# Patient Record
Sex: Male | Born: 1985 | Race: White | Hispanic: No | Marital: Single | Smoking: Smoker, current status unknown
Health system: Southern US, Community
[De-identification: ages and names within clinical notes are randomized; demographics above are authoritative.]

## PROBLEM LIST (undated history)

## (undated) HISTORY — PX: FOOT SURGERY: SHX648

---

## 2013-06-05 ENCOUNTER — Emergency Department (HOSPITAL_COMMUNITY): Payer: No Typology Code available for payment source | Admitting: Anesthesiology

## 2013-06-05 ENCOUNTER — Encounter (HOSPITAL_COMMUNITY): Payer: No Typology Code available for payment source | Admitting: Anesthesiology

## 2013-06-05 ENCOUNTER — Observation Stay (HOSPITAL_COMMUNITY)
Admission: EM | Admit: 2013-06-05 | Discharge: 2013-06-06 | Disposition: A | Discharge status: Home / Self Care | Payer: No Typology Code available for payment source | Attending: Urology | Admitting: Urology

## 2013-06-05 ENCOUNTER — Other Ambulatory Visit: Payer: Self-pay | Admitting: Urology

## 2013-06-05 ENCOUNTER — Encounter (HOSPITAL_COMMUNITY): Admission: EM | Disposition: A | Discharge status: Home / Self Care | Payer: Self-pay | Source: Home / Self Care

## 2013-06-05 ENCOUNTER — Emergency Department (HOSPITAL_COMMUNITY): Payer: No Typology Code available for payment source

## 2013-06-05 ENCOUNTER — Encounter (HOSPITAL_COMMUNITY): Payer: Self-pay | Admitting: Emergency Medicine

## 2013-06-05 DIAGNOSIS — N4889 Other specified disorders of penis: Secondary | ICD-10-CM | POA: Insufficient documentation

## 2013-06-05 DIAGNOSIS — X500XXA Overexertion from strenuous movement or load, initial encounter: Secondary | ICD-10-CM | POA: Insufficient documentation

## 2013-06-05 DIAGNOSIS — S39840A Fracture of corpus cavernosum penis, initial encounter: Principal | ICD-10-CM | POA: Insufficient documentation

## 2013-06-05 HISTORY — PX: CYSTOSCOPY: SHX5120

## 2013-06-05 HISTORY — PX: REPAIR OF FRACTURED PENIS: SHX6063

## 2013-06-05 LAB — URINE MICROSCOPIC-ADD ON

## 2013-06-05 LAB — URINALYSIS, ROUTINE W REFLEX MICROSCOPIC
BILIRUBIN URINE: NEGATIVE
Glucose, UA: NEGATIVE mg/dL
Hgb urine dipstick: NEGATIVE
Ketones, ur: NEGATIVE mg/dL
Nitrite: NEGATIVE
Protein, ur: NEGATIVE mg/dL
Specific Gravity, Urine: 1.02 (ref 1.005–1.030)
UROBILINOGEN UA: 1 mg/dL (ref 0.0–1.0)
pH: 7.5 (ref 5.0–8.0)

## 2013-06-05 SURGERY — REPAIR, FRACTURE, PENIS
Anesthesia: General | Site: Penis

## 2013-06-05 MED ORDER — FENTANYL CITRATE 0.05 MG/ML IJ SOLN
INTRAMUSCULAR | Status: AC
  Filled 2013-06-05: qty 5

## 2013-06-05 MED ORDER — FENTANYL CITRATE 0.05 MG/ML IJ SOLN
25.0000 ug | INTRAMUSCULAR | Status: DC | PRN
  Administered 2013-06-05 (×2): 50 ug via INTRAVENOUS

## 2013-06-05 MED ORDER — MIDAZOLAM HCL 5 MG/5ML IJ SOLN
INTRAMUSCULAR | Status: DC | PRN
  Administered 2013-06-05: 2 mg via INTRAVENOUS

## 2013-06-05 MED ORDER — 0.9 % SODIUM CHLORIDE (POUR BTL) OPTIME
TOPICAL | Status: DC | PRN
  Administered 2013-06-05: 1000 mL

## 2013-06-05 MED ORDER — HYDROCODONE-ACETAMINOPHEN 5-325 MG PO TABS: 1.0000 | ORAL_TABLET | ORAL | Status: DC | PRN

## 2013-06-05 MED ORDER — MEPERIDINE HCL 50 MG/ML IJ SOLN: 6.2500 mg | INTRAMUSCULAR | Status: DC | PRN

## 2013-06-05 MED ORDER — LACTATED RINGERS IV SOLN: INTRAVENOUS | Status: DC

## 2013-06-05 MED ORDER — PROMETHAZINE HCL 25 MG/ML IJ SOLN: 6.2500 mg | INTRAMUSCULAR | Status: DC | PRN

## 2013-06-05 MED ORDER — SODIUM CHLORIDE 0.9 % IR SOLN
Status: DC | PRN
  Administered 2013-06-05: 1000 mL

## 2013-06-05 MED ORDER — CEFAZOLIN SODIUM-DEXTROSE 2-3 GM-% IV SOLR
INTRAVENOUS | Status: DC | PRN
  Administered 2013-06-05: 2 g via INTRAVENOUS

## 2013-06-05 MED ORDER — ZOLPIDEM TARTRATE 5 MG PO TABS: 5.0000 mg | ORAL_TABLET | Freq: Every evening | ORAL | Status: DC | PRN

## 2013-06-05 MED ORDER — ONDANSETRON HCL 4 MG/2ML IJ SOLN: 4.0000 mg | INTRAMUSCULAR | Status: DC | PRN

## 2013-06-05 MED ORDER — FENTANYL CITRATE 0.05 MG/ML IJ SOLN
INTRAMUSCULAR | Status: AC
  Filled 2013-06-05: qty 2

## 2013-06-05 MED ORDER — SODIUM CHLORIDE 0.9 % IV BOLUS (SEPSIS)
1000.0000 mL | Freq: Once | INTRAVENOUS | Status: AC
  Administered 2013-06-05: 1000 mL via INTRAVENOUS

## 2013-06-05 MED ORDER — NICOTINE 21 MG/24HR TD PT24
21.0000 mg | MEDICATED_PATCH | Freq: Once | TRANSDERMAL | Status: DC
  Administered 2013-06-05: 21 mg via TRANSDERMAL
  Filled 2013-06-05 (×2): qty 1

## 2013-06-05 MED ORDER — PROPOFOL 10 MG/ML IV BOLUS
INTRAVENOUS | Status: AC
  Filled 2013-06-05: qty 20

## 2013-06-05 MED ORDER — MIDAZOLAM HCL 2 MG/2ML IJ SOLN
INTRAMUSCULAR | Status: AC
  Filled 2013-06-05: qty 2

## 2013-06-05 MED ORDER — FENTANYL CITRATE 0.05 MG/ML IJ SOLN
INTRAMUSCULAR | Status: DC | PRN
  Administered 2013-06-05: 100 ug via INTRAVENOUS
  Administered 2013-06-05: 50 ug via INTRAVENOUS

## 2013-06-05 MED ORDER — ONDANSETRON HCL 4 MG/2ML IJ SOLN
INTRAMUSCULAR | Status: AC
  Filled 2013-06-05: qty 2

## 2013-06-05 MED ORDER — HYDROMORPHONE HCL PF 1 MG/ML IJ SOLN: 0.5000 mg | INTRAMUSCULAR | Status: DC | PRN

## 2013-06-05 MED ORDER — ONDANSETRON HCL 4 MG/2ML IJ SOLN
INTRAMUSCULAR | Status: DC | PRN
  Administered 2013-06-05: 4 mg via INTRAVENOUS

## 2013-06-05 MED ORDER — CEFAZOLIN SODIUM-DEXTROSE 2-3 GM-% IV SOLR: 2.0000 g | Freq: Once | INTRAVENOUS | Status: DC

## 2013-06-05 MED ORDER — BUPIVACAINE HCL (PF) 0.25 % IJ SOLN
INTRAMUSCULAR | Status: AC
  Filled 2013-06-05: qty 30

## 2013-06-05 MED ORDER — SUCCINYLCHOLINE CHLORIDE 20 MG/ML IJ SOLN
INTRAMUSCULAR | Status: DC | PRN
  Administered 2013-06-05: 100 mg via INTRAVENOUS

## 2013-06-05 MED ORDER — BUPIVACAINE HCL (PF) 0.25 % IJ SOLN
INTRAMUSCULAR | Status: DC | PRN
  Administered 2013-06-05: 10 mL

## 2013-06-05 MED ORDER — LACTATED RINGERS IV SOLN
INTRAVENOUS | Status: DC | PRN
  Administered 2013-06-05: 17:00:00 via INTRAVENOUS

## 2013-06-05 MED ORDER — SODIUM CHLORIDE 0.45 % IV SOLN
INTRAVENOUS | Status: DC
  Administered 2013-06-05: 20:00:00 via INTRAVENOUS

## 2013-06-05 MED ORDER — CEFAZOLIN SODIUM-DEXTROSE 2-3 GM-% IV SOLR
INTRAVENOUS | Status: AC
  Filled 2013-06-05: qty 50

## 2013-06-05 MED ORDER — PROPOFOL 10 MG/ML IV BOLUS
INTRAVENOUS | Status: DC | PRN
  Administered 2013-06-05: 170 mg via INTRAVENOUS

## 2013-06-05 SURGICAL SUPPLY — 33 items
BLADE HEX COATED 2.75 (ELECTRODE) ×4 IMPLANT
BNDG COHESIVE 1X5 TAN STRL LF (GAUZE/BANDAGES/DRESSINGS) ×2 IMPLANT
BNDG CONFORM 2 STRL LF (GAUZE/BANDAGES/DRESSINGS) ×2 IMPLANT
COVER SURGICAL LIGHT HANDLE (MISCELLANEOUS) ×4 IMPLANT
DECANTER SPIKE VIAL GLASS SM (MISCELLANEOUS) IMPLANT
DRAIN PENROSE 18X1/4 LTX STRL (WOUND CARE) ×4 IMPLANT
ELECT REM PT RETURN 9FT ADLT (ELECTROSURGICAL) ×4
ELECTRODE REM PT RTRN 9FT ADLT (ELECTROSURGICAL) ×2 IMPLANT
GAUZE VASELINE 1X8 (GAUZE/BANDAGES/DRESSINGS) ×2 IMPLANT
GLOVE BIOGEL M 7.0 STRL (GLOVE) ×4 IMPLANT
GLOVE BIOGEL PI IND STRL 6 (GLOVE) IMPLANT
GLOVE BIOGEL PI INDICATOR 6 (GLOVE) ×2
GLOVE SURG SS PI 6.0 STRL IVOR (GLOVE) ×2 IMPLANT
GOWN STRL REUS W/TWL LRG LVL3 (GOWN DISPOSABLE) ×6 IMPLANT
HOLDER FOLEY CATH W/STRAP (MISCELLANEOUS) ×2 IMPLANT
KIT BASIN OR (CUSTOM PROCEDURE TRAY) ×4 IMPLANT
LUBRICANT JELLY K Y 4OZ (MISCELLANEOUS) ×2 IMPLANT
MANIFOLD NEPTUNE II (INSTRUMENTS) ×2 IMPLANT
NEEDLE HYPO 22GX1.5 SAFETY (NEEDLE) ×2 IMPLANT
NS IRRIG 1000ML POUR BTL (IV SOLUTION) ×4 IMPLANT
PACK GENERAL/GYN (CUSTOM PROCEDURE TRAY) ×4 IMPLANT
SET CYSTO W/LG BORE CLAMP LF (SET/KITS/TRAYS/PACK) ×2 IMPLANT
SPONGE GAUZE 4X4 12PLY (GAUZE/BANDAGES/DRESSINGS) ×2 IMPLANT
SUPPORT SCROTAL LG STRP (MISCELLANEOUS) ×3 IMPLANT
SUPPORTER ATHLETIC LG (MISCELLANEOUS) ×1
SUT CHROMIC 3 0 PS 2 (SUTURE) ×2 IMPLANT
SUT CHROMIC 3 0 SH 27 (SUTURE) IMPLANT
SUT CHROMIC 4 0 SH 27 (SUTURE) ×6 IMPLANT
SUT PDS AB 4-0 SH 27 (SUTURE) ×2 IMPLANT
SUT VIC AB 0 UR5 27 (SUTURE) IMPLANT
SYR CONTROL 10ML LL (SYRINGE) ×2 IMPLANT
TRAY FOLEY METER SIL LF 16FR (CATHETERS) ×2 IMPLANT
WATER STERILE IRR 1500ML POUR (IV SOLUTION) ×2 IMPLANT

## 2013-06-05 NOTE — H&P (Signed)
Mark Pugh is an 28 y.o. male.    HPI:  The patient is a 28 years old male who presented to the ER this morning with swelling and pain of the penis.  He states that he woke up with an erection.  He rolled over in bed and tried to bend the penis.  He then heard a pop and started having pain in the penis.  Then the penis became swollen.  He thinks that the penis is 3 times its normal size.  He voided three times since.  His urine has been clear.  Penile ultrasound shows a rupture of the left corpus cavernosum with hematoma in the subcutaneous tissues.  The right corpus cavernosum and the spongiosum are normal.  History reviewed. No pertinent past medical history.  Past Surgical History  Procedure Laterality Date  . Foot surgery      No prescriptions prior to admission    Allergies: No Known Allergies  No family history on file.  Social History:  reports that he has been smoking.  He does not have any smokeless tobacco history on file. He reports that he drinks alcohol. His drug history is not on file.  Review of Systems: Pertinent items are noted in HPI. A comprehensive review of systems was negative except for: swelling and ecchymosis of penis  Results for orders placed during the hospital encounter of 06/05/13 (from the past 48 hour(s))  URINALYSIS, ROUTINE W REFLEX MICROSCOPIC     Status: Abnormal   Collection Time    06/05/13 12:47 PM      Result Value Ref Range   Color, Urine YELLOW  YELLOW   APPearance CLEAR  CLEAR   Specific Gravity, Urine 1.020  1.005 - 1.030   pH 7.5  5.0 - 8.0   Glucose, UA NEGATIVE  NEGATIVE mg/dL   Hgb urine dipstick NEGATIVE  NEGATIVE   Bilirubin Urine NEGATIVE  NEGATIVE   Ketones, ur NEGATIVE  NEGATIVE mg/dL   Protein, ur NEGATIVE  NEGATIVE mg/dL   Urobilinogen, UA 1.0  0.0 - 1.0 mg/dL   Nitrite NEGATIVE  NEGATIVE   Leukocytes, UA TRACE (*) NEGATIVE  URINE MICROSCOPIC-ADD ON     Status: None   Collection Time    06/05/13 12:47 PM   Result Value Ref Range   WBC, UA 3-6  <3 WBC/hpf   Bacteria, UA RARE  RARE    US Pelvis Limited  06/05/2013   CLINICAL DATA:  Hematoma of the penis.  Question fracture.  EXAM: US PELVIS LIMITED  TECHNIQUE: Ultrasound examination of the pelvic soft tissues was performed in the area of clinical concern.  COMPARISON:  None.  FINDINGS: The corpus spongiosum and right corpus cavernosum are intact. There is a large hematoma external to the corpus cavernosum on the left. Real-time scanning revealed a breech in the tunica albuginea along the lateral aspect of the left corpus cavernosum. This corresponds with the area of hematoma.  IMPRESSION: 1. Hematoma along the left-sided penis corresponding with a focal area of increased echogenicity and breeches of the tunica albuginea constituting a fracture of the left corpus cavernosum. The right corpus cavernosum and corpus spongiosum are intact.   Electronically Signed   By: Gennette Pac M.D.   On: 06/05/2013 13:50    Temp:  [97.1 F (36.2 C)] 97.1 F (36.2 C) (02/19 1129) Pulse Rate:  [63-75] 68 (02/19 1600) Resp:  [16-22] 16 (02/19 1600) BP: (100-125)/(60-86) 107/61 mmHg (02/19 1600) SpO2:  [97 %-100 %] 98 % (02/19  1600) Weight:  [73.211 kg (161 lb 6.4 oz)] 73.211 kg (161 lb 6.4 oz) (02/19 1129)  Physical Exam: General appearance: alert and appears stated age Head: Normocephalic, without obvious abnormality, atraumatic Eyes: conjunctivae/corneas clear. EOM's intact.  Oropharynx: moist mucous membranes Neck: supple, symmetrical, trachea midline Resp: normal respiratory effort Cardio: regular rate and rhythm Back: symmetric, no curvature. ROM normal. No CVA tenderness. GI: soft, non-tender; bowel sounds normal; no masses,  no organomegaly Male genitalia: The penis swollen and ecchymotic.  The swelling is more pronounced at the distal shaft of the penis on the left side.  The ecchymosis extends from the coronal sulcus to the base of the penis.  The  scrotum and suprapubic area are not ecchymotic. Testes: bilaterally descended with no masses or tenderness. no hernias Perineum is normal. Extremities: extremities normal, atraumatic, no cyanosis or edema Skin: Skin color normal. No visible rashes or lesions Neurologic: Grossly normal  Assessment/Plan Fracture of penis  Exploration of penis.  Repair penile fracture.  Cystoscopy. The procedure, risks, benefits were discussed with the patient.  The risks include but are not limited to infection, hemorrhage, erectile impotence, Peyronie's disease.  He understands and wishes to proceed.  Lindaann Sloughesi, Sreekar Broyhill Henry 06/05/2013, 4:56 PM

## 2013-06-05 NOTE — Op Note (Signed)
Mark HockeyJames Pugh is a 28 y.o.   06/05/2013  General  Pre-op diagnosis: Fracture of penis  Postop diagnosis: Same  Procedure done: Cystoscopy, exploration of penis, ureteral fracture of penis  Surgeon: Wendie SimmerMarc H. Tayah Idrovo  Anesthesia: General  Indication: Patient is a 28 years old male who presented to the emergency room this morning with swelling and pain of the penis. He states that he woke up with an erection. He rolled over and tried to bend the penis. He heard a pop and he started having pain in the penis. Then the penis became swollen. According to him the penis is 3 times its normal size. He voided about 3 times and the urine has been clear. The penis is warm and with ecchymosis on the left side of the penis. I was not able to feel a tear in the tunica albuginea. Ultrasound of the penis  showed a rupture of the left corpus cavernosum with hematoma in the subcutaneous tissues. The right corpus and the spongiosum are normal. He is scheduled now for exploration of the penis and a repair or fracture of the penis.  Procedure: Patient was identified by his wrist band and proper timeout was taken.  Under general anesthesia he was prepped and draped and placed in the supine position. A flexible cystoscope was inserted in the urethra. The urethra is normal. There is no blood at the meatus. The entire urethra is normal. He has normal prostatic urethra. The bladder mucosa is normal. There is no stone tumor or blood in the bladder. The cystoscope was removed. And a #16 [place] Foley catheter was inserted in the bladder.  Circumferential incision was made about 2 cm from the coronal sulcus. The incision was carried down to the  corpus cavernosum. The penile skin was then degloved down to the base of the penis. The urethra was identified and preserved throughout the course of the procedure. At the mid shaft of the penis on the left side there is a 1 cm transverse rupture of the tunica albuginea. Bright red blood  was coming out of the site of the fracture. The urethra is normal. There is no evidence of urethral injury. The wound was then irrigated with normal saline. The edges of the incisions were clean. The fracture of the penis was then repaired with #4-0 PDS using running sutures. There was no evidence of bleeding at the end of the procedure. The subcutaneous tissues were approximated with #4-0 chromic. The skin was closed with #4-0 chromic using interrupted sutures  EBL: Minimal  Needles, sponges count: Correct  Patient tolerated the procedure well and left the OR in satisfactory condition to postanesthesia care unit.

## 2013-06-05 NOTE — Transfer of Care (Signed)
Immediate Anesthesia Transfer of Care Note  Patient: Mark Pugh  Procedure(s) Performed: Procedure(s): REPAIR OF FRACTURED PENIS (N/A) CYSTOSCOPY FLEXIBLE (N/A)  Patient Location: PACU  Anesthesia Type:General  Level of Consciousness: awake, sedated and patient cooperative  Airway & Oxygen Therapy: Patient Spontanous Breathing and Patient connected to face mask oxygen  Post-op Assessment: Report given to PACU RN and Post -op Vital signs reviewed and stable  Post vital signs: Reviewed and stable  Complications: No apparent anesthesia complications

## 2013-06-05 NOTE — ED Provider Notes (Signed)
CSN: [id number]     Arrival date & time 06/05/13  1114 History   First MD Initiated Contact with Patient 06/05/13 1131     Chief Complaint  Patient presents with  . Penis Pain     (Consider location/radiation/quality/duration/timing/severity/associated sxs/prior Treatment) HPI Comments: 28 year old male presents about one hour after injuring his penis. He states he woke up with erection and rolled over in bed to go urinate. As he rolled over he heard a pop in his penis became swollen. He was able to urinate immediately after without any blood. He has not urinated since but has not felt the urge. He has had nothing to eat or drink this morning. Patient states that he feels like his penis has tripled in size and is ecchymotic. There is no pain while at rest but whenever clothes touch it or he moves such as walking then he gets severe pain.   History reviewed. No pertinent past medical history. Past Surgical History  Procedure Laterality Date  . Foot surgery     No family history on file. History  Substance Use Topics  . Smoking status: Smoker, Current Status Unknown  . Smokeless tobacco: Not on file  . Alcohol Use: Yes    Review of Systems  Genitourinary: Positive for penile swelling and penile pain. Negative for dysuria, hematuria, scrotal swelling and testicular pain.  All other systems reviewed and are negative.      Allergies  Review of patient's allergies indicates no known allergies.  Home Medications  No current outpatient prescriptions on file. BP 125/86  Pulse 68  Temp(Src) 97.1 F (36.2 C) (Oral)  Resp 22  Ht 6\' 1"  (1.854 m)  Wt 161 lb 6.4 oz (73.211 kg)  BMI 21.30 kg/m2  SpO2 100% Physical Exam  Nursing note and vitals reviewed. Constitutional: He is oriented to person, place, and time. He appears well-developed and well-nourished.  HENT:  Head: Normocephalic and atraumatic.  Right Ear: External ear normal.  Left Ear: External ear normal.  Nose: Nose  normal.  Eyes: Right eye exhibits no discharge. Left eye exhibits no discharge.  Neck: Neck supple.  Cardiovascular: Normal rate, regular rhythm, normal heart sounds and intact distal pulses.   Pulmonary/Chest: Effort normal.  Abdominal: He exhibits no distension.  Genitourinary: Testes normal.    Right testis shows no mass and no swelling. Left testis shows no mass and no swelling. Penile tenderness present.  Neurological: He is alert and oriented to person, place, and time.  Skin: Skin is warm and dry.    ED Course  Procedures (including critical care time) Labs Review Labs Reviewed  URINALYSIS, ROUTINE W REFLEX MICROSCOPIC - Abnormal; Notable for the following:    Leukocytes, UA TRACE (*)    All other components within normal limits  URINE MICROSCOPIC-ADD ON   Imaging Review US Pelvis Limited  06/05/2013   CLINICAL DATA:  Hematoma of the penis.  Question fracture.  EXAM: US PELVIS LIMITED  TECHNIQUE: Ultrasound examination of the pelvic soft tissues was performed in the area of clinical concern.  COMPARISON:  None.  FINDINGS: The corpus spongiosum and right corpus cavernosum are intact. There is a large hematoma external to the corpus cavernosum on the left. Real-time scanning revealed a breech in the tunica albuginea along the lateral aspect of the left corpus cavernosum. This corresponds with the area of hematoma.  IMPRESSION: 1. Hematoma along the left-sided penis corresponding with a focal area of increased echogenicity and breeches of the tunica albuginea constituting a  fracture of the left corpus cavernosum. The right corpus cavernosum and corpus spongiosum are intact.   Electronically Signed   By: Gennette Pachris  Mattern M.D.   On: 06/05/2013 13:50    EKG Interpretation   None       MDM   Final diagnoses:  Penile fracture    11:56 AM D/w Dr. Brunilda PayorNesi, who recommends penile u/s. Patient has no pain at this time as long as there is no palpation or touching. Declines pain  meds.  1:42 PM D/w Radiology, they see tear of tunica albuginea with surrounding hematoma, appears outside of corpus cavernosum. Will discuss again with Dr. Brunilda PayorNesi.  Dr. Brunilda PayorNesi has reviewed U/S and would [facility] available for repair. No pain for patient currently. Will remain NPO and will transfer when OR is available.  Audree CamelScott T Orphia Mctigue, MD 06/05/13 [id number]

## 2013-06-05 NOTE — ED Notes (Signed)
Charge RN aware of transfer and reviewed chart.

## 2013-06-05 NOTE — ED Notes (Signed)
Woke up w/ am errection and rolled over and it got caught in bed and he heard a pop now penis is swollen and painful to walk

## 2013-06-05 NOTE — Anesthesia Preprocedure Evaluation (Signed)
Anesthesia Evaluation  Patient identified by MRN, date of birth, ID band Patient awake    Reviewed: Allergy & Precautions, H&P , NPO status , Patient's Chart, lab work & pertinent test results  Airway Mallampati: II  TM Distance: >3 FB Neck ROM: Full    Dental no notable dental hx.    Pulmonary neg pulmonary ROS,  breath sounds clear to auscultation  Pulmonary exam normal       Cardiovascular negative cardio ROS  Rhythm:Regular Rate:Normal     Neuro/Psych negative neurological ROS  negative psych ROS   GI/Hepatic negative GI ROS, Neg liver ROS,   Endo/Other  negative endocrine ROS  Renal/GU negative Renal ROS  negative genitourinary   Musculoskeletal negative musculoskeletal ROS (+)   Abdominal   Peds negative pediatric ROS (+)  Hematology negative hematology ROS (+)   Anesthesia Other Findings   Reproductive/Obstetrics negative OB ROS                             Anesthesia Physical Anesthesia Plan  ASA: I and emergent  Anesthesia Plan: General   Post-op Pain Management:    Induction: Intravenous  Airway Management Planned: Oral ETT  Additional Equipment:   Intra-op Plan:   Post-operative Plan: Extubation in OR  Informed Consent: I have reviewed the patients History and Physical, chart, labs and discussed the procedure including the risks, benefits and alternatives for the proposed anesthesia with the patient or authorized representative who has indicated his/her understanding and acceptance.   Dental advisory given  Plan Discussed with: CRNA  Anesthesia Plan Comments:         Anesthesia Quick Evaluation  

## 2013-06-05 NOTE — Anesthesia Postprocedure Evaluation (Signed)
  Anesthesia Post-op Note  Patient: Mark Pugh  Procedure(s) Performed: Procedure(s) (LRB): REPAIR OF FRACTURED PENIS (N/A) CYSTOSCOPY FLEXIBLE (N/A)  Patient Location: PACU  Anesthesia Type: General  Level of Consciousness: awake and alert   Airway and Oxygen Therapy: Patient Spontanous Breathing  Post-op Pain: mild  Post-op Assessment: Post-op Vital signs reviewed, Patient's Cardiovascular Status Stable, Respiratory Function Stable, Patent Airway and No signs of Nausea or vomiting  Last Vitals:  Filed Vitals:   06/05/13 1845  BP:   Pulse:   Temp: 36.5 C  Resp:     Post-op Vital Signs: stable   Complications: No apparent anesthesia complications

## 2013-06-06 ENCOUNTER — Encounter (HOSPITAL_COMMUNITY): Payer: Self-pay | Admitting: Urology

## 2013-06-06 MED ORDER — CEPHALEXIN 250 MG PO CAPS: 250.0000 mg | ORAL_CAPSULE | Freq: Four times a day (QID) | ORAL | Status: DC

## 2013-06-06 MED ORDER — HYDROCODONE-ACETAMINOPHEN 5-325 MG PO TABS: 1.0000 | ORAL_TABLET | Freq: Four times a day (QID) | ORAL | Status: DC | PRN

## 2013-06-06 NOTE — Progress Notes (Signed)
Pt left hospital at this time with family at his side.  Discharge instructions/prescriptions given/explained with pt verbalizing understanding. Followup appointment noted.  No pt c/o; oriented and alert.

## 2013-06-06 NOTE — Discharge Summary (Signed)
Physician Discharge Summary  Patient ID: Mark HockeyJames Ruhland MRN: [id number] DOB/AGE: Jan 25, 1986 28 y.o.  Admit date: 06/05/2013 Discharge date: 06/06/2013  Admission Diagnoses:  Fracture of penis  Discharge Diagnoses:  Active Problems:   Fracture of corpus cavernosum penis   Discharged Condition: Improved  Hospital Course: The patient was admitted on 2/19 for penile fracture.  He had exploration of the penis and repair of penile fracture on 2/19.  The Foley catheter was removed this morning.  He has been voiding well.  The urine is clear.  The penis is less swollen.  It is ecchymotic.  The meatus is normal.   There is no urethral bleeding.   The scrotum is normal in appearance.  The testicles are normal and non tender.  Consults: None  Significant Diagnostic Studies: Penile doppler ultrasound  Treatments: Cystoscopy.  Exploration of penis.  Repair penile fracture.    Discharge Exam: Blood pressure 122/73, pulse 68, temperature 97.7 F (36.5 C), temperature source Oral, resp. rate 16, height 6' (1.829 m), weight 73.6 kg (162 lb 4.1 oz), SpO2 100.00%. Penis: ecchymotic.  Less swollen.  Meatus normal. Scrotum: normal  Disposition: Discharge home.    Medication List         cephALEXin 250 MG capsule  Commonly known as:  KEFLEX  Take 1 capsule (250 mg total) by mouth 4 (four) times daily.     HYDROcodone-acetaminophen 5-325 MG per tablet  Commonly known as:  NORCO  Take 1 tablet by mouth every 6 (six) hours as needed for moderate pain.         Signed: Lindaann Sloughesi, Jashiya Bassett Henry 06/06/2013, 11:09 AM

## 2013-06-06 NOTE — Discharge Instructions (Addendum)
Penile Fracture °Fracture of the penis is an uncommon injury of the erect penis. This injury most often happens during forceful sexual intercourse, but it may happen under any circumstances when an erection occurs. This is not a fractured bone. The penis contains only soft and fibrous (tough leathery) tissue. The tough fibrous layers inside are the structures which fill up with blood during an erection (hardening of the penis). If a rupture or break of one of these layers occurs, it is called a penile fracture. There may also be injury to the urethra (the tube in the penis which carries the urine from the bladder). °SYMPTOMS  °This injury is usually noticed right away because of: °· Pain. °· A changed shape of the penis. °DIAGNOSIS  °· The diagnosis can be made by your caregiver talking to you and learning how the injury happened. °· The diagnosis can be made by examining you. °· Specialized testing may sometimes be done to confirm a suspected diagnosis or to find out what damage needs to be repaired during surgery. These tests include: °· Ultrasonography (imaging technique used to look inside the body). °· Cavernosography (x-ray of the blood flow in the penis). °· Urethrography (x-ray of the urethra). °TREATMENT  °When the problem is severe, it is considered a surgical emergency. The sooner the problem is repaired, the more likely there will be a good result. Emergency surgical repair offers the best chance of recovery with a correct working penis.  °If conservative (nonsurgical) treatment is used, there may be other complications such as: °· A bulging out of the side of the penis (aneurysm) °· Scarring and hardening of the penis °· Abnormal curvature °· Erectile dysfunction (problems with impotence) °· The final outcome will be unsatisfactory or poor. °Minor cases of fractured penis may be treated conservatively. The decision to treat conservatively or with surgery should be made with your urologist to fully  understand the pros and cons of these different treatments. °Document Released: 02/15/2004 Document Revised: 06/26/2011 Document Reviewed: 02/14/2008 °Exit[employer]® Patient Information ©2014 [employer]. ° °

## 2014-06-28 IMAGING — US US PELVIS LIMITED
2 series · 14 of 25 positions shown · non-contrast
Comparison: None.

CLINICAL DATA: Hematoma of the penis.  Question fracture.

EXAM:
US PELVIS LIMITED
TECHNIQUE: Ultrasound examination of the pelvic soft tissues was performed in
the area of clinical concern.

[Series 1: us pelvis limited · 0.07mm/px · 12 of 42 slices shown (1 of 2)]
[im 1/42]
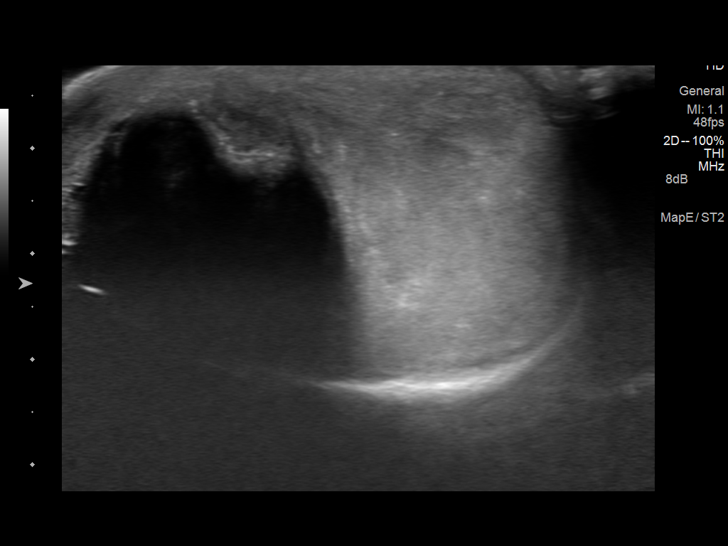
[im 5/42]
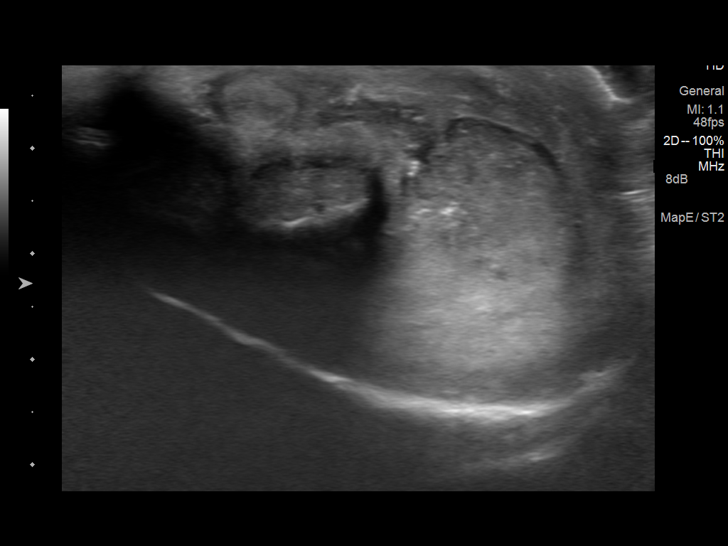
[im 9/42]
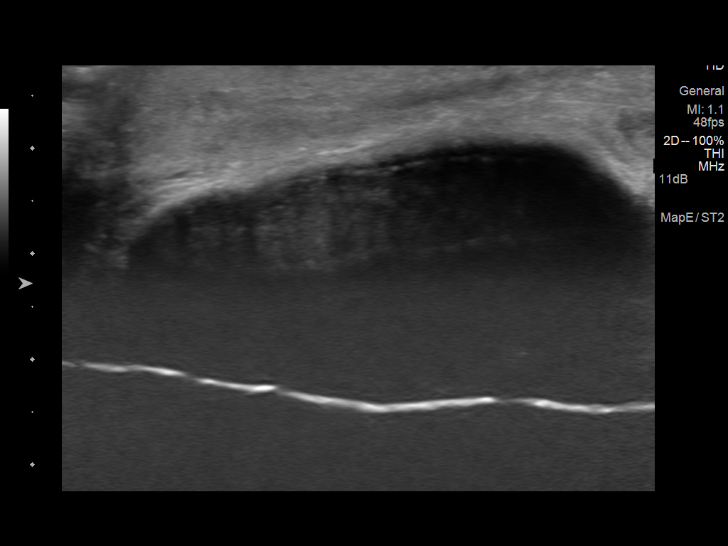
[im 13/42]
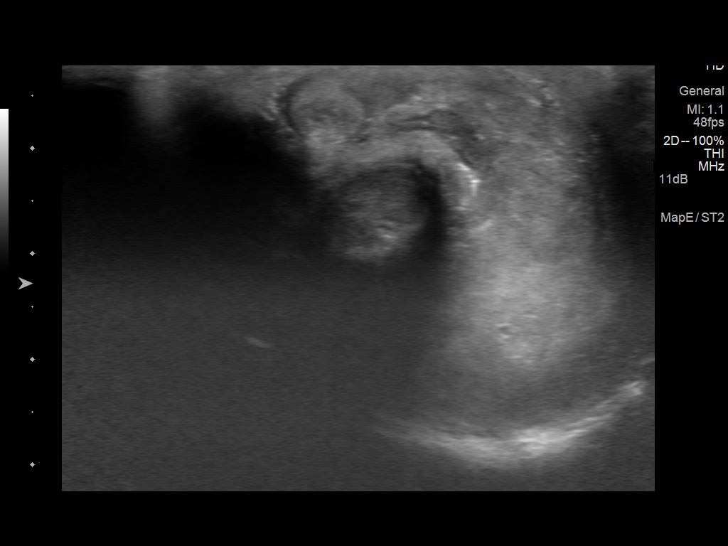
[im 17/42]
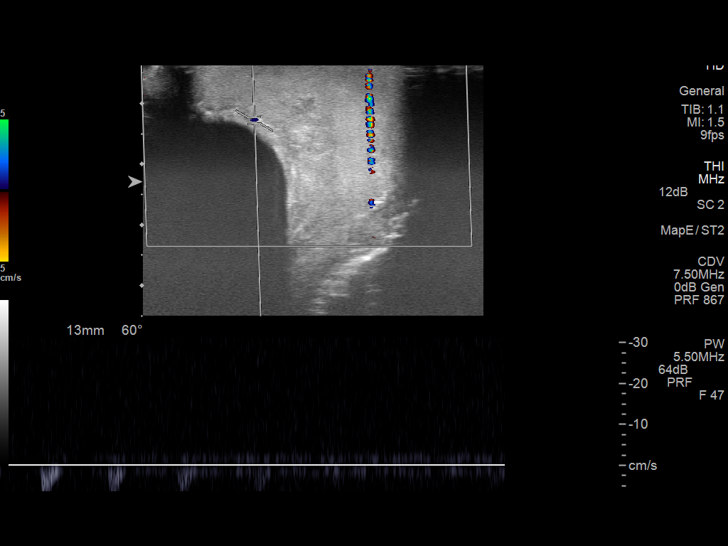
[im 19/42]
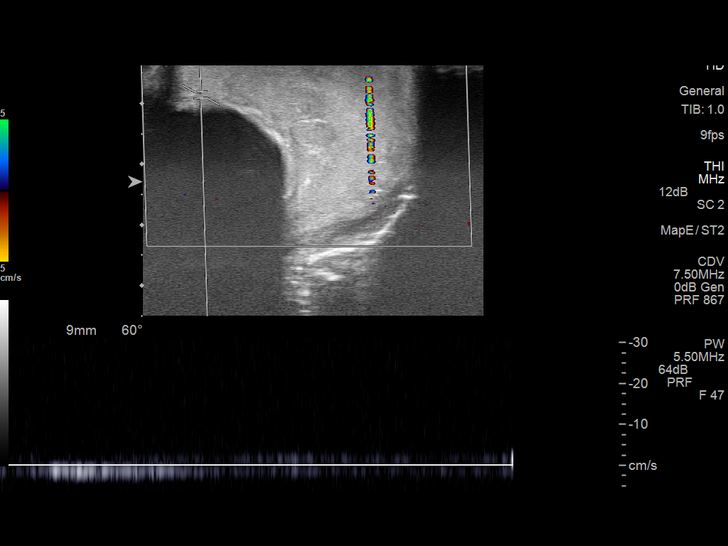
[im 23/42]
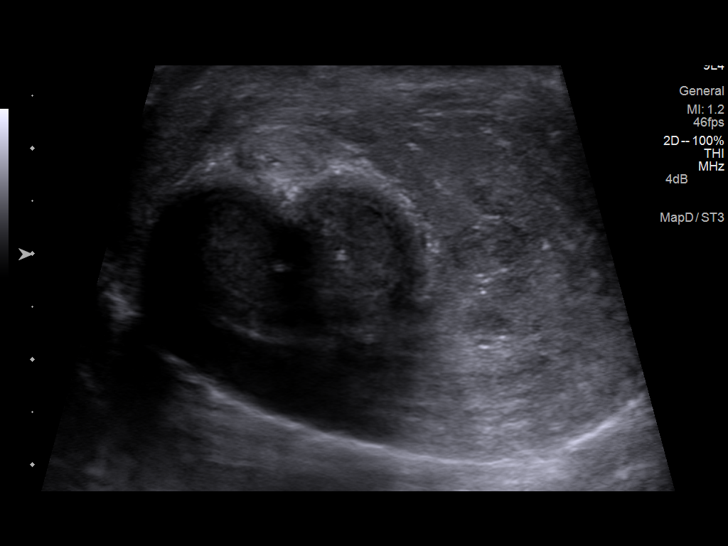
[im 27/42]
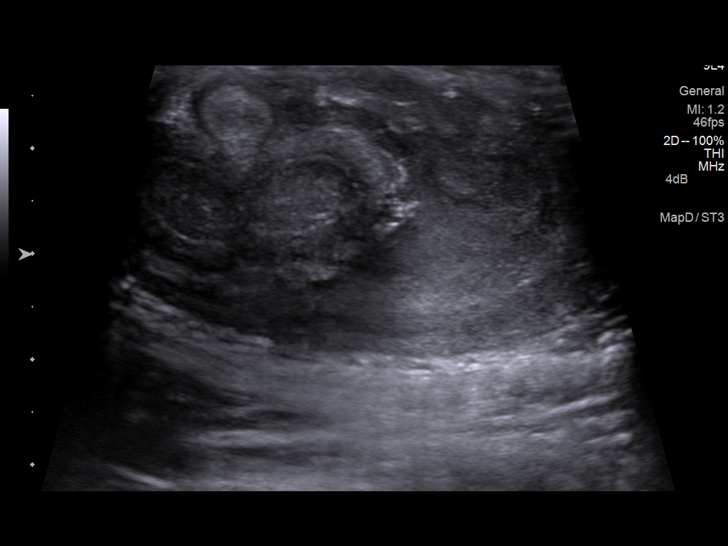
[im 31/42]
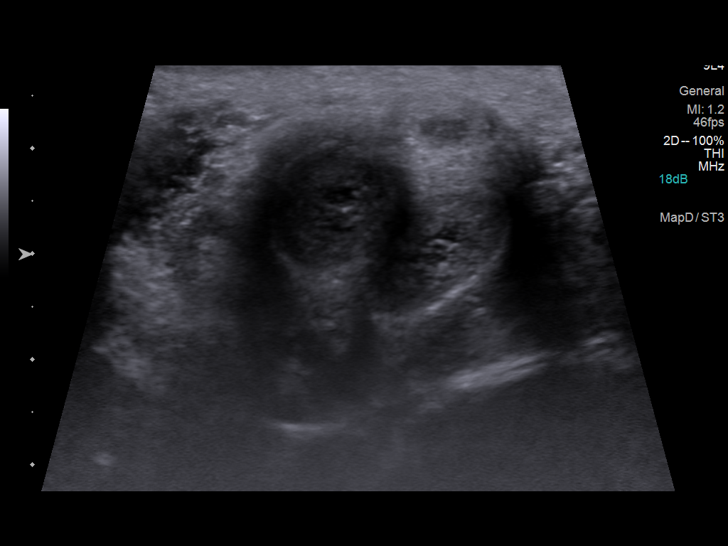
[im 33/42]
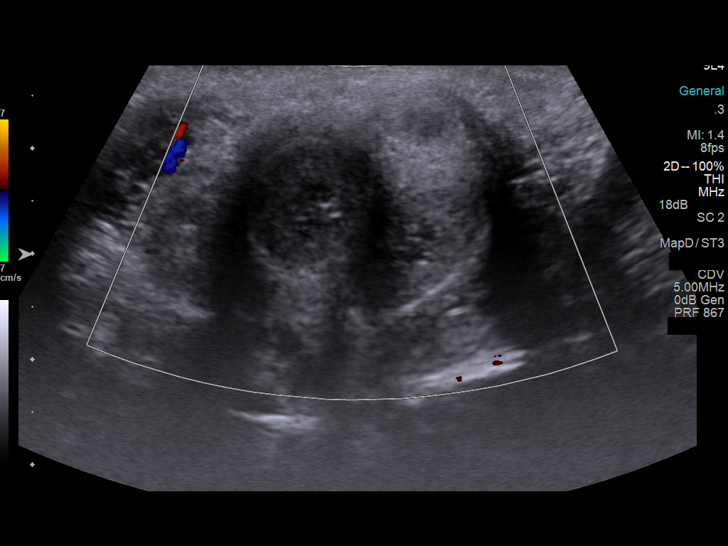
[im 37/42]
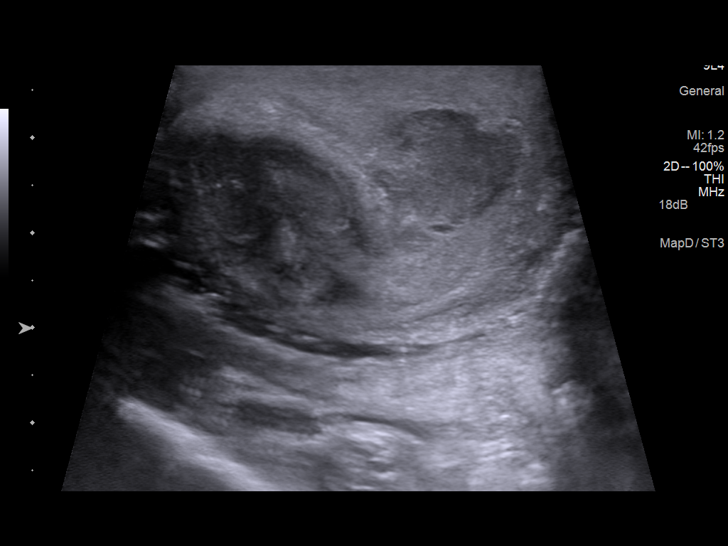
[im 42/42]
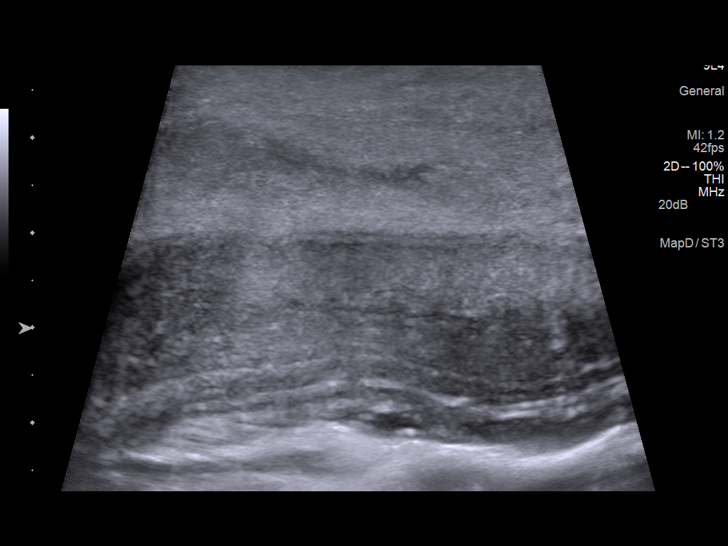

[Series 2: us pelvis limited · 0.06mm/px · 2 of 8 slices shown (2 of 2)]
[im 3/8]
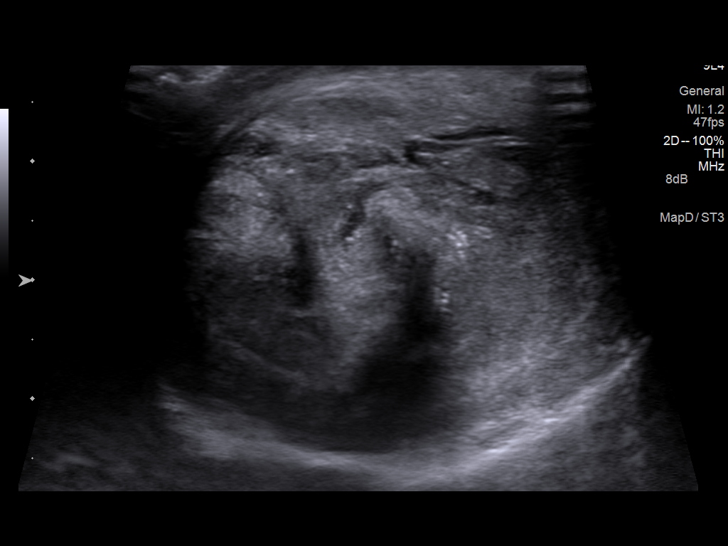
[im 8/8]
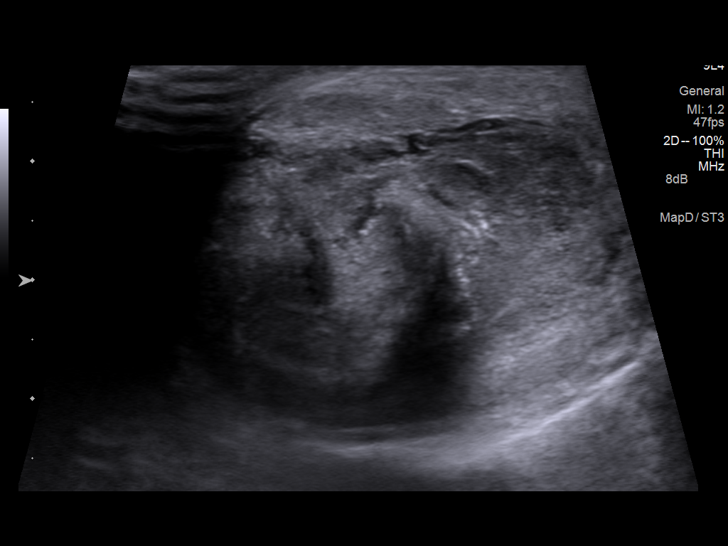

[14 of 25 positions shown; findings below may reference images not displayed]

FINDINGS: The corpus spongiosum and right corpus cavernosum are intact. There
is a large hematoma external to the corpus cavernosum on the left.
Real-time scanning revealed a breech in the tunica albuginea along
the lateral aspect of the left corpus cavernosum. This corresponds
with the area of hematoma.
IMPRESSION: 1. Hematoma along the left-sided penis corresponding with a focal
area of increased echogenicity and breeches of the tunica albuginea
constituting a fracture of the left corpus cavernosum. The right
corpus cavernosum and corpus spongiosum are intact.

## 2018-08-22 ENCOUNTER — Encounter (HOSPITAL_BASED_OUTPATIENT_CLINIC_OR_DEPARTMENT_OTHER): Payer: Self-pay | Admitting: *Deleted

## 2018-08-22 ENCOUNTER — Other Ambulatory Visit: Payer: Self-pay

## 2018-08-26 ENCOUNTER — Other Ambulatory Visit (HOSPITAL_COMMUNITY)
Admission: RE | Admit: 2018-08-26 | Discharge: 2018-08-26 | Disposition: A | Discharge status: Home / Self Care | Payer: HRSA Program | Source: Ambulatory Visit | Attending: Orthopaedic Surgery | Admitting: Orthopaedic Surgery

## 2018-08-26 ENCOUNTER — Other Ambulatory Visit: Payer: Self-pay

## 2018-08-26 DIAGNOSIS — Z1159 Encounter for screening for other viral diseases: Secondary | ICD-10-CM | POA: Insufficient documentation

## 2018-08-27 LAB — NOVEL CORONAVIRUS, NAA (HOSP ORDER, SEND-OUT TO REF LAB; TAT 18-24 HRS): SARS-CoV-2, NAA: NOT DETECTED

## 2018-08-28 ENCOUNTER — Ambulatory Visit (HOSPITAL_BASED_OUTPATIENT_CLINIC_OR_DEPARTMENT_OTHER): Payer: PRIVATE HEALTH INSURANCE | Admitting: Certified Registered"

## 2018-08-28 ENCOUNTER — Encounter (HOSPITAL_BASED_OUTPATIENT_CLINIC_OR_DEPARTMENT_OTHER)
Admission: RE | Disposition: A | Discharge status: Home / Self Care | Payer: Self-pay | Source: Home / Self Care | Attending: Orthopaedic Surgery

## 2018-08-28 ENCOUNTER — Encounter (HOSPITAL_BASED_OUTPATIENT_CLINIC_OR_DEPARTMENT_OTHER): Payer: Self-pay

## 2018-08-28 ENCOUNTER — Ambulatory Visit (HOSPITAL_BASED_OUTPATIENT_CLINIC_OR_DEPARTMENT_OTHER)
Admission: RE | Admit: 2018-08-28 | Discharge: 2018-08-28 | Disposition: A | Discharge status: Home / Self Care | Payer: PRIVATE HEALTH INSURANCE | Attending: Orthopaedic Surgery | Admitting: Orthopaedic Surgery

## 2018-08-28 ENCOUNTER — Other Ambulatory Visit: Payer: Self-pay

## 2018-08-28 DIAGNOSIS — X58XXXA Exposure to other specified factors, initial encounter: Secondary | ICD-10-CM | POA: Diagnosis not present

## 2018-08-28 DIAGNOSIS — S52591A Other fractures of lower end of right radius, initial encounter for closed fracture: Secondary | ICD-10-CM | POA: Diagnosis present

## 2018-08-28 DIAGNOSIS — S52551A Other extraarticular fracture of lower end of right radius, initial encounter for closed fracture: Secondary | ICD-10-CM | POA: Insufficient documentation

## 2018-08-28 DIAGNOSIS — F17210 Nicotine dependence, cigarettes, uncomplicated: Secondary | ICD-10-CM | POA: Insufficient documentation

## 2018-08-28 HISTORY — PX: OPEN REDUCTION INTERNAL FIXATION (ORIF) DISTAL RADIAL FRACTURE: SHX5989

## 2018-08-28 SURGERY — OPEN REDUCTION INTERNAL FIXATION (ORIF) DISTAL RADIUS FRACTURE
Anesthesia: Regional | Site: Wrist | Laterality: Right

## 2018-08-28 MED ORDER — OXYCODONE HCL 5 MG PO TABS: ORAL_TABLET | ORAL | 0 refills | Status: AC

## 2018-08-28 MED ORDER — KETOROLAC TROMETHAMINE 30 MG/ML IJ SOLN: 30.0000 mg | Freq: Once | INTRAMUSCULAR | Status: DC | PRN

## 2018-08-28 MED ORDER — GLYCOPYRROLATE 0.2 MG/ML IJ SOLN
INTRAMUSCULAR | Status: DC | PRN
  Administered 2018-08-28: 0.2 mg via INTRAVENOUS

## 2018-08-28 MED ORDER — MELOXICAM 7.5 MG PO TABS: 7.5000 mg | ORAL_TABLET | Freq: Every day | ORAL | 2 refills | Status: AC

## 2018-08-28 MED ORDER — CEFAZOLIN SODIUM-DEXTROSE 2-4 GM/100ML-% IV SOLN
INTRAVENOUS | Status: AC
  Filled 2018-08-28: qty 100

## 2018-08-28 MED ORDER — GLYCOPYRROLATE PF 0.2 MG/ML IJ SOSY
PREFILLED_SYRINGE | INTRAMUSCULAR | Status: AC
  Filled 2018-08-28: qty 1

## 2018-08-28 MED ORDER — CHLORHEXIDINE GLUCONATE 4 % EX LIQD: 60.0000 mL | Freq: Once | CUTANEOUS | Status: DC

## 2018-08-28 MED ORDER — ACETAMINOPHEN 500 MG PO TABS
1000.0000 mg | ORAL_TABLET | Freq: Once | ORAL | Status: AC
  Administered 2018-08-28: 1000 mg via ORAL

## 2018-08-28 MED ORDER — OXYCODONE HCL 5 MG/5ML PO SOLN: 5.0000 mg | Freq: Once | ORAL | Status: DC | PRN

## 2018-08-28 MED ORDER — ACETAMINOPHEN 500 MG PO TABS: 1000.0000 mg | ORAL_TABLET | Freq: Three times a day (TID) | ORAL | 0 refills | Status: AC

## 2018-08-28 MED ORDER — MIDAZOLAM HCL 2 MG/2ML IJ SOLN
INTRAMUSCULAR | Status: AC
  Filled 2018-08-28: qty 2

## 2018-08-28 MED ORDER — FENTANYL CITRATE (PF) 100 MCG/2ML IJ SOLN
INTRAMUSCULAR | Status: AC
  Filled 2018-08-28: qty 2

## 2018-08-28 MED ORDER — ACETAMINOPHEN 500 MG PO TABS
ORAL_TABLET | ORAL | Status: AC
  Filled 2018-08-28: qty 2

## 2018-08-28 MED ORDER — DEXAMETHASONE SODIUM PHOSPHATE 10 MG/ML IJ SOLN
INTRAMUSCULAR | Status: DC | PRN
  Administered 2018-08-28: 5 mg via INTRAVENOUS

## 2018-08-28 MED ORDER — PROPOFOL 10 MG/ML IV BOLUS
INTRAVENOUS | Status: AC
  Filled 2018-08-28: qty 40

## 2018-08-28 MED ORDER — MIDAZOLAM HCL 2 MG/2ML IJ SOLN
1.0000 mg | INTRAMUSCULAR | Status: DC | PRN
  Administered 2018-08-28 (×2): 2 mg via INTRAVENOUS

## 2018-08-28 MED ORDER — PROPOFOL 10 MG/ML IV BOLUS
INTRAVENOUS | Status: DC | PRN
  Administered 2018-08-28: 300 mg via INTRAVENOUS

## 2018-08-28 MED ORDER — ONDANSETRON HCL 4 MG/2ML IJ SOLN
INTRAMUSCULAR | Status: AC
  Filled 2018-08-28: qty 2

## 2018-08-28 MED ORDER — ROPIVACAINE HCL 5 MG/ML IJ SOLN
INTRAMUSCULAR | Status: DC | PRN
  Administered 2018-08-28: 30 mL via PERINEURAL

## 2018-08-28 MED ORDER — CEFAZOLIN SODIUM-DEXTROSE 2-4 GM/100ML-% IV SOLN
2.0000 g | INTRAVENOUS | Status: AC
  Administered 2018-08-28: 10:00:00 2 g via INTRAVENOUS

## 2018-08-28 MED ORDER — ONDANSETRON HCL 4 MG/2ML IJ SOLN
INTRAMUSCULAR | Status: DC | PRN
  Administered 2018-08-28: 4 mg via INTRAVENOUS

## 2018-08-28 MED ORDER — PROMETHAZINE HCL 25 MG/ML IJ SOLN: 6.2500 mg | INTRAMUSCULAR | Status: DC | PRN

## 2018-08-28 MED ORDER — LACTATED RINGERS IV SOLN
INTRAVENOUS | Status: DC
  Administered 2018-08-28 (×2): via INTRAVENOUS

## 2018-08-28 MED ORDER — HYDROMORPHONE HCL 1 MG/ML IJ SOLN: 0.2500 mg | INTRAMUSCULAR | Status: DC | PRN

## 2018-08-28 MED ORDER — SCOPOLAMINE 1 MG/3DAYS TD PT72: 1.0000 | MEDICATED_PATCH | Freq: Once | TRANSDERMAL | Status: DC | PRN

## 2018-08-28 MED ORDER — OXYCODONE HCL 5 MG PO TABS: 5.0000 mg | ORAL_TABLET | Freq: Once | ORAL | Status: DC | PRN

## 2018-08-28 MED ORDER — ONDANSETRON HCL 4 MG PO TABS: 4.0000 mg | ORAL_TABLET | Freq: Three times a day (TID) | ORAL | 1 refills | Status: AC | PRN

## 2018-08-28 MED ORDER — PROPOFOL 500 MG/50ML IV EMUL
INTRAVENOUS | Status: AC
  Filled 2018-08-28: qty 50

## 2018-08-28 MED ORDER — FENTANYL CITRATE (PF) 100 MCG/2ML IJ SOLN
50.0000 ug | INTRAMUSCULAR | Status: DC | PRN
  Administered 2018-08-28: 100 ug via INTRAVENOUS

## 2018-08-28 MED ORDER — CEFAZOLIN SODIUM-DEXTROSE 2-3 GM-%(50ML) IV SOLR
INTRAVENOUS | Status: DC | PRN
  Administered 2018-08-28: 2 g via INTRAVENOUS

## 2018-08-28 SURGICAL SUPPLY — 73 items
APL SKNCLS STERI-STRIP NONHPOA (GAUZE/BANDAGES/DRESSINGS) ×1
BANDAGE ACE 3X5.8 VEL STRL LF (GAUZE/BANDAGES/DRESSINGS) ×3 IMPLANT
BANDAGE ACE 4X5 VEL STRL LF (GAUZE/BANDAGES/DRESSINGS) ×3 IMPLANT
BENZOIN TINCTURE PRP APPL 2/3 (GAUZE/BANDAGES/DRESSINGS) ×3 IMPLANT
BIT DRILL 2.2 SS TIBIAL (BIT) ×2 IMPLANT
BLADE HEX COATED 2.75 (ELECTRODE) ×3 IMPLANT
BLADE SURG 15 STRL LF DISP TIS (BLADE) ×1 IMPLANT
BLADE SURG 15 STRL SS (BLADE) ×3
BNDG CMPR 9X4 STRL LF SNTH (GAUZE/BANDAGES/DRESSINGS) ×1
BNDG COHESIVE 3X5 TAN STRL LF (GAUZE/BANDAGES/DRESSINGS) ×3 IMPLANT
BNDG ESMARK 4X9 LF (GAUZE/BANDAGES/DRESSINGS) ×3 IMPLANT
BRUSH SCRUB EZ PLAIN DRY (MISCELLANEOUS) ×3 IMPLANT
CANISTER SUCT 1200ML W/VALVE (MISCELLANEOUS) IMPLANT
CLOSURE WOUND 1/2 X4 (GAUZE/BANDAGES/DRESSINGS) ×1
COVER BACK TABLE REUSABLE LG (DRAPES) ×3 IMPLANT
COVER WAND RF STERILE (DRAPES) IMPLANT
CUFF TOURN SGL QUICK 18X4 (TOURNIQUET CUFF) ×3 IMPLANT
DECANTER SPIKE VIAL GLASS SM (MISCELLANEOUS) IMPLANT
DRAPE EXTREMITY T 121X128X90 (DISPOSABLE) ×3 IMPLANT
DRAPE IMP U-DRAPE 54X76 (DRAPES) ×3 IMPLANT
DRAPE OEC MINIVIEW 54X84 (DRAPES) ×3 IMPLANT
DRAPE SURG 17X23 STRL (DRAPES) ×3 IMPLANT
DRSG EMULSION OIL 3X3 NADH (GAUZE/BANDAGES/DRESSINGS) ×2 IMPLANT
ELECT REM PT RETURN 9FT ADLT (ELECTROSURGICAL) ×3
ELECTRODE REM PT RTRN 9FT ADLT (ELECTROSURGICAL) ×1 IMPLANT
GAUZE SPONGE 4X4 12PLY STRL (GAUZE/BANDAGES/DRESSINGS) ×3 IMPLANT
GLOVE BIOGEL PI IND STRL 8 (GLOVE) ×1 IMPLANT
GLOVE BIOGEL PI INDICATOR 8 (GLOVE) ×2
GLOVE ECLIPSE 8.0 STRL XLNG CF (GLOVE) ×3 IMPLANT
GOWN STRL REUS W/ TWL LRG LVL3 (GOWN DISPOSABLE) ×1 IMPLANT
GOWN STRL REUS W/ TWL XL LVL3 (GOWN DISPOSABLE) IMPLANT
GOWN STRL REUS W/TWL LRG LVL3 (GOWN DISPOSABLE) ×3
GOWN STRL REUS W/TWL XL LVL3 (GOWN DISPOSABLE) ×6 IMPLANT
K-WIRE 1.6 (WIRE) ×6
K-WIRE FX5X1.6XNS BN SS (WIRE) ×2
KWIRE FX5X1.6XNS BN SS (WIRE) IMPLANT
NDL HYPO 25X1 1.5 SAFETY (NEEDLE) ×1 IMPLANT
NEEDLE HYPO 25X1 1.5 SAFETY (NEEDLE) ×3 IMPLANT
NS IRRIG 1000ML POUR BTL (IV SOLUTION) IMPLANT
PACK BASIN DAY SURGERY FS (CUSTOM PROCEDURE TRAY) ×3 IMPLANT
PAD CAST 4YDX4 CTTN HI CHSV (CAST SUPPLIES) ×1 IMPLANT
PADDING CAST COTTON 4X4 STRL (CAST SUPPLIES) ×3
PEG LOCKING SMOOTH 2.2X16 (Screw) ×2 IMPLANT
PEG LOCKING SMOOTH 2.2X18 (Peg) ×2 IMPLANT
PEG LOCKING SMOOTH 2.2X20 (Screw) ×2 IMPLANT
PEG LOCKING SMOOTH 2.2X22 (Screw) ×4 IMPLANT
PEG LOCKING SMOOTH 2.2X24 (Peg) ×2 IMPLANT
PENCIL BUTTON HOLSTER BLD 10FT (ELECTRODE) ×3 IMPLANT
PLATE DVR CROSSLOCK STD RT (Plate) ×2 IMPLANT
SCREW LOCK 16X2.7X 3 LD TPR (Screw) IMPLANT
SCREW LOCK 18X2.7X 3 LD TPR (Screw) IMPLANT
SCREW LOCKING 2.7X16 (Screw) ×6 IMPLANT
SCREW LOCKING 2.7X18 (Screw) ×3 IMPLANT
SCREW NLOCK 26X2.7X3 LD THRD (Screw) IMPLANT
SCREW NONLOCK 2.7X26MM (Screw) ×3 IMPLANT
SLEEVE SCD COMPRESS KNEE MED (MISCELLANEOUS) ×3 IMPLANT
SLING ARM FOAM STRAP LRG (SOFTGOODS) ×2 IMPLANT
SPLINT PLASTER CAST XFAST 3X15 (CAST SUPPLIES) ×10 IMPLANT
SPLINT PLASTER XTRA FASTSET 3X (CAST SUPPLIES) ×20
SPONGE LAP 4X18 RFD (DISPOSABLE) IMPLANT
STRIP CLOSURE SKIN 1/2X4 (GAUZE/BANDAGES/DRESSINGS) ×2 IMPLANT
SUCTION FRAZIER HANDLE 10FR (MISCELLANEOUS)
SUCTION TUBE FRAZIER 10FR DISP (MISCELLANEOUS) IMPLANT
SUT MNCRL AB 4-0 PS2 18 (SUTURE) ×3 IMPLANT
SUT VIC AB 3-0 SH 27 (SUTURE)
SUT VIC AB 3-0 SH 27X BRD (SUTURE) IMPLANT
SYR BULB 3OZ (MISCELLANEOUS) ×3 IMPLANT
SYR CONTROL 10ML LL (SYRINGE) ×3 IMPLANT
TOWEL GREEN STERILE FF (TOWEL DISPOSABLE) ×3 IMPLANT
TRAY DSU PREP LF (CUSTOM PROCEDURE TRAY) ×3 IMPLANT
TUBE CONNECTING 20'X1/4 (TUBING) ×1
TUBE CONNECTING 20X1/4 (TUBING) ×2 IMPLANT
YANKAUER SUCT BULB TIP NO VENT (SUCTIONS) IMPLANT

## 2018-08-28 NOTE — H&P (Signed)
PREOPERATIVE H&P  Chief Complaint: RIGHT DISTAL RADIUS FRACTURE  HPI: Mark Pugh is a 33 y.o. male who presents for preoperative history and physical with a diagnosis of RIGHT DISTAL RADIUS FRACTURE. Symptoms are rated as moderate to severe, and have been worsening.  This is significantly impairing activities of daily living.  Please see my clinic note for full details on this patient's care.  He has elected for surgical management.   History reviewed. No pertinent past medical history. Past Surgical History:  Procedure Laterality Date  . CYSTOSCOPY N/A 06/05/2013   Procedure: CYSTOSCOPY FLEXIBLE;  Surgeon: Su GrandMarc Nesi, MD;  Location: [facility];  Service: Urology;  Laterality: N/A;  . FOOT SURGERY    . REPAIR OF FRACTURED PENIS N/A 06/05/2013   Procedure: REPAIR OF FRACTURED PENIS;  Surgeon: Su GrandMarc Nesi, MD;  Location: [facility];  Service: Urology;  Laterality: N/A;   Social History   Socioeconomic History  . Marital status: Single    Spouse name: Not on file  . Number of children: Not on file  . Years of education: Not on file  . Highest education level: Not on file  Occupational History  . Not on file  Social Needs  . Financial resource strain: Not on file  . Food insecurity:    Worry: Not on file    Inability: Not on file  . Transportation needs:    Medical: Not on file    Non-medical: Not on file  Tobacco Use  . Smoking status: Smoker, Current Status Unknown    Packs/day: 1.00    Types: Cigarettes  . Smokeless tobacco: Never Used  Substance and Sexual Activity  . Alcohol use: Yes    Comment: occas  . Drug use: Never  . Sexual activity: Not on file  Lifestyle  . Physical activity:    Days per week: Not on file    Minutes per session: Not on file  . Stress: Not on file  Relationships  . Social connections:    Talks on phone: Not on file    Gets together: Not on file    Attends religious service: Not on file    Active member of club or organization: Not on file   Attends meetings of clubs or organizations: Not on file    Relationship status: Not on file  Other Topics Concern  . Not on file  Social History Narrative  . Not on file   History reviewed. No pertinent family history. No Known Allergies Prior to Admission medications   Medication Sig Start Date End Date Taking? Authorizing Provider  traMADol (ULTRAM) 50 MG tablet Take by mouth every 6 (six) hours as needed.   Yes [provider]     Positive ROS: All other systems have been reviewed and were otherwise negative with the exception of those mentioned in the HPI and as above.  Physical Exam: General: Alert, no acute distress Cardiovascular: No pedal edema Respiratory: No cyanosis, no use of accessory musculature GI: No organomegaly, abdomen is soft and non-tender Skin: No lesions in the area of chief complaint Neurologic: Sensation intact distally Psychiatric: Patient is competent for consent with normal mood and affect Lymphatic: No axillary or cervical lymphadenopathy  MUSCULOSKELETAL: R wrist in splint, wiggling fingers, wwp  Assessment: RIGHT DISTAL RADIUS FRACTURE  Plan: Plan for Procedure(s): OPEN REDUCTION INTERNAL FIXATION (ORIF) RIGHT DISTAL RADIAL FRACTURE  The risks benefits and alternatives were discussed with the patient including but not limited to the risks of nonoperative treatment, versus surgical  intervention including infection, bleeding, nerve injury,  blood clots, cardiopulmonary complications, morbidity, mortality, among others, and they were willing to proceed.   Bjorn Pippin, MD  08/28/2018 9:25 AM

## 2018-08-28 NOTE — Anesthesia Postprocedure Evaluation (Signed)
Anesthesia Post Note  Patient: Montrice Tidrick  Procedure(s) Performed: OPEN REDUCTION INTERNAL FIXATION (ORIF) RIGHT DISTAL RADIAL FRACTURE (Right Wrist)     Patient location during evaluation: PACU Anesthesia Type: Regional and General Level of consciousness: awake and alert Pain management: pain level controlled Vital Signs Assessment: post-procedure vital signs reviewed and stable Respiratory status: spontaneous breathing, nonlabored ventilation, respiratory function stable and patient connected to nasal cannula oxygen Cardiovascular status: blood pressure returned to baseline and stable Postop Assessment: no apparent nausea or vomiting Anesthetic complications: no    Last Vitals:  Vitals:   08/28/18 1115 08/28/18 1130  BP: 110/85 116/84  Pulse: 75 76  Resp: 14 16  Temp:  (!) 36.3 C  SpO2: 96% 100%    Last Pain:  Vitals:   08/28/18 1130  TempSrc:   PainSc: 0-No pain                 Chyanna Flock P Jenavieve Freda

## 2018-08-28 NOTE — Op Note (Signed)
Orthopaedic Surgery Operative Note (CSN: [id number])  Mark Pugh  May 02, 1985 Date of Surgery: 08/28/2018   Diagnoses:  RIGHT DISTAL RADIUS FRACTURE  Procedure: Extraarticular distal radius ORIF   Operative Finding Successful completion of planned procedure.  Good bone quality.  Comminuted posteriorly.  Good fixation.  Post-operative plan: The patient will be nwb in splint.  The patient will be dc home.  DVT prophylaxis not indicated in isolated upper extremity surgery patient with no specific risks factors.  Pain control with PRN pain medication preferring oral medicines.  Follow up plan will be scheduled in approximately 7 days for incision check and XR.  Post-Op Diagnosis: Same Surgeons:Primary: Bjorn Pippin, MD Assistants:Brandon Marcell Barlow Location: [facility] OR ROOM 6 Anesthesia: General Antibiotics: Ancef 2g preop, Vancomycin 1000mg  locally  Tourniquet time:  Total Tourniquet Time Documented: Upper Arm (Right) - 17 minutes Total: Upper Arm (Right) - 17 minutes  Estimated Blood Loss: minimal Complications: None Specimens: None Implants: Implant Name Type Inv. Item Serial No. Manufacturer Lot No. LRB No. Used Action  PLATE DVR CROSSLOCK STD RT - [id number] Plate PLATE DVR CROSSLOCK STD RT  [employer](ORTH,TRAU,[employer],SG) [id number] Right 1 Implanted  SCREW NONLOCK 2.7X26MM - [id number] Screw SCREW NONLOCK 2.7X26MM  [employer](ORTH,TRAU,BIO,SG) [id number] Right 1 Implanted  PEG LOCKING SMOOTH 2.2X24 - [id number] Peg PEG LOCKING SMOOTH 2.2X24  [employer](ORTH,TRAU,BIO,SG) [id number] Right 1 Implanted  PEG LOCKING SMOOTH 2.2X22 - [id number] Screw PEG LOCKING SMOOTH 2.2X22  [employer](ORTH,TRAU,BIO,SG) [id number] Right 2 Implanted  PEG LOCKING SMOOTH 2.2X18 - [id number] Peg PEG LOCKING SMOOTH 2.2X18  [employer](ORTH,TRAU,BIO,SG) [id number] Right 1 Implanted  PEG LOCKING SMOOTH 2.2X16 - [id number] Screw PEG LOCKING SMOOTH 2.2X16  [employer](ORTH,TRAU,BIO,SG) [id number] Right 1 Implanted   PEG LOCKING SMOOTH 2.2X20 - [id number] Screw PEG LOCKING SMOOTH 2.2X20  [employer](ORTH,TRAU,BIO,SG) [id number] Right 1 Implanted  SCREW LOCKING 2.7X16 - [id number] Screw SCREW LOCKING 2.7X16  [employer](ORTH,TRAU,BIO,SG) [id number] Right 2 Implanted  SCREW LOCKING 2.7X18 - [id number] Screw SCREW LOCKING 2.7X18  [employer](ORTH,TRAU,BIO,SG) [id number] Right 1 Implanted    Indications for Surgery:   Mark Pugh is a 33 y.o. male with work related fall resulting in comminuted displaced distal radius fracture.  Benefits and risks of operative and nonoperative management were discussed prior to surgery with patient/guardian(s) and informed consent form was completed.  Specific risks including infection, need for additional surgery, hardware pain, CRPS, nerve and vessel damage.   Procedure:   The patient was identified in the preoperative holding area where the surgical site was marked. The patient was taken to the OR where a procedural timeout was called and the above noted anesthesia was induced.  The patient was positioned supine on hand table.  Preoperative antibiotics were dosed.  The patient's right wrist was prepped and draped in the usual sterile fashion.  A second preoperative timeout was called.      A tourniquet was used for the above listed time.   An FCR approach was made exposing the volar surface of the distal radius taking care to go through the sheath of the FCR tendon tract and ulnarly exposing the inferior portion of the sheath while protecting the median nerve and radial artery on each side with blunt retractors.  This inferior portion of the sheath was incised sharply and examined for presence of the palmar cutaneous branch of the median nerve.  It was determined to not be within the field and we carried our dissection deeply to the bone splitting the pronator quadratus and exposing the fracture site.  Appropriate reduction was obtained and a standard Right DVR [employer] plate was  placed and checked for sizing and reduction under fluoroscopy.  This reduction was held in place with K wires and a K wire was placed into the radial styloid.    Once appropriate reduction was confirmed we then proceeded to fix the plate proximally and then proceeded to fill the distal holes with a combination of partially threaded screws and pegs.  At this point we checked our reduction to ensure that there was no intra-articular extension of our screws.  Once this was confirmed we proceeded to fill remaining 3 proximal shaft screws and obtained final images which demonstrated appropriate reduction and maintenance of alignment.  The DRUJ was checked and found to be stable.  We verified that all fast guides were removed on XR and through count.    The wound was thoroughly irrigated.  The tourniquet was released prior to skin closure to verify there was no excessive bleeding and we visualized that the radial artery and median nerve were intact at the end of the case. The PQ was reapproximated grossly prior to skin closure.    The incision was thoroughly irrigated and closed in a multilayer fashion with absorbable sutures. A sterile dressing was placed.  Demisplint was placed. The patient was awoken from general anesthesia and taken to the PACU in stable condition without complication.   Janace LittenBrandon Parry, OPA-C, present and scrubbed throughout the case, critical for completion in a timely fashion, and for retraction, instrumentation, closure.

## 2018-08-28 NOTE — Transfer of Care (Signed)
Immediate Anesthesia Transfer of Care Note  Patient: Mark Pugh  Procedure(s) Performed: OPEN REDUCTION INTERNAL FIXATION (ORIF) RIGHT DISTAL RADIAL FRACTURE (Right Wrist)  Patient Location: PACU  Anesthesia Type:General and Regional  Level of Consciousness: awake, alert  and oriented  Airway & Oxygen Therapy: Patient Spontanous Breathing and Patient connected to face mask oxygen  Post-op Assessment: Report given to RN and Post -op Vital signs reviewed and stable  Post vital signs: Reviewed and stable  Last Vitals:  Vitals Value Taken Time  BP    Temp    Pulse 82 08/28/2018 10:54 AM  Resp 9 08/28/2018 10:54 AM  SpO2 97 % 08/28/2018 10:54 AM  Vitals shown include unvalidated device data.  Last Pain:  Vitals:   08/28/18 0909  TempSrc: Oral  PainSc: 0-No pain         Complications: No apparent anesthesia complications

## 2018-08-28 NOTE — Anesthesia Procedure Notes (Signed)
Anesthesia Regional Block: Supraclavicular block   Pre-Anesthetic Checklist: ,, timeout performed, Correct Patient, Correct Site, Correct Laterality, Correct Procedure, Correct Position, site marked, Risks and benefits discussed,  Surgical consent,  Pre-op evaluation,  At surgeon's request and post-op pain management  Laterality: Right  Prep: chloraprep       Needles:  Injection technique: Single-shot  Needle Type: Echogenic Stimulator Needle     Needle Length: 9cm  Needle Gauge: 21     Additional Needles:   Procedures:,,,, ultrasound used (permanent image in chart),,,,  Narrative:  Start time: 08/28/2018 9:30 AM End time: 08/28/2018 9:40 AM Injection made incrementally with aspirations every 5 mL.  Performed by: Personally  Anesthesiologist: Leonides Grills, MD  Additional Notes: Functioning IV was confirmed and monitors were applied.  A timeout was performed. Sterile prep, hand hygiene and sterile gloves were used. A 19mm 21ga Arrow echogenic stimulator needle was used. Negative aspiration and negative test dose prior to incremental administration of local anesthetic. The patient tolerated the procedure well.  Ultrasound guidance: relevent anatomy identified, needle position confirmed, local anesthetic spread visualized around nerve(s), vascular puncture avoided.  Image printed for medical record.

## 2018-08-28 NOTE — Discharge Instructions (Signed)
Regional Anesthesia Blocks ? ?1. Numbness or the inability to move the "blocked" extremity may last from 3-48 hours after placement. The length of time depends on the medication injected and your individual response to the medication. If the numbness is not going away after 48 hours, call your surgeon. ? ?2. The extremity that is blocked will need to be protected until the numbness is gone and the  Strength has returned. Because you cannot feel it, you will need to take extra care to avoid injury. Because it may be weak, you may have difficulty moving it or using it. You may not know what position it is in without looking at it while the block is in effect. ? ?3. For blocks in the legs and feet, returning to weight bearing and walking needs to be done carefully. You will need to wait until the numbness is entirely gone and the strength has returned. You should be able to move your leg and foot normally before you try and bear weight or walk. You will need someone to be with you when you first try to ensure you do not fall and possibly risk injury. ? ?4. Bruising and tenderness at the needle site are common side effects and will resolve in a few days. ? ?5. Persistent numbness or new problems with movement should be communicated to the surgeon or the [facility] ([phone])/ [facility] ([phone]).  ? ?Post Anesthesia Home Care Instructions ? ?Activity: ?Get plenty of rest for the remainder of the day. A responsible individual must stay with you for 24 hours following the procedure.  ?For the next 24 hours, DO NOT: ?-Drive a car ?-Operate machinery ?-Drink alcoholic beverages ?-Take any medication unless instructed by your physician ?-Make any legal decisions or sign important papers. ? ?Meals: ?Start with liquid foods such as gelatin or soup. Progress to regular foods as tolerated. Avoid greasy, spicy, heavy foods. If nausea and/or vomiting occur, drink only clear liquids until the  nausea and/or vomiting subsides. Call your physician if vomiting continues. ? ?Special Instructions/Symptoms: ?Your throat may feel dry or sore from the anesthesia or the breathing tube placed in your throat during surgery. If this causes discomfort, gargle with warm salt water. The discomfort should disappear within 24 hours. ? ?If you had a scopolamine patch placed behind your ear for the management of post- operative nausea and/or vomiting: ? ?1. The medication in the patch is effective for 72 hours, after which it should be removed.  Wrap patch in a tissue and discard in the trash. Wash hands thoroughly with soap and water. ?2. You may remove the patch earlier than 72 hours if you experience unpleasant side effects which may include dry mouth, dizziness or visual disturbances. ?3. Avoid touching the patch. Wash your hands with soap and water after contact with the patch. ?    ?

## 2018-08-28 NOTE — Anesthesia Preprocedure Evaluation (Addendum)
Anesthesia Evaluation  Patient identified by MRN, date of birth, ID band Patient awake    Reviewed: Allergy & Precautions, NPO status , Patient's Chart, lab work & pertinent test results  Airway Mallampati: I  TM Distance: >3 FB Neck ROM: Full    Dental no notable dental hx.    Pulmonary Current Smoker,    Pulmonary exam normal breath sounds clear to auscultation       Cardiovascular negative cardio ROS Normal cardiovascular exam Rhythm:Regular Rate:Normal     Neuro/Psych negative neurological ROS  negative psych ROS   GI/Hepatic negative GI ROS, Neg liver ROS,   Endo/Other  negative endocrine ROS  Renal/GU negative Renal ROS     Musculoskeletal Motor and sensation intact to right hand   Abdominal   Peds  Hematology negative hematology ROS (+)   Anesthesia Other Findings RIGHT DISTAL RADIUS FRACTURE  Reproductive/Obstetrics                            Anesthesia Physical Anesthesia Plan  ASA: II  Anesthesia Plan: General and Regional   Post-op Pain Management: GA combined w/ Regional for post-op pain   Induction:   PONV Risk Score and Plan: 2 and Ondansetron, Dexamethasone, Midazolam and Treatment may vary due to age or medical condition  Airway Management Planned: LMA  Additional Equipment:   Intra-op Plan:   Post-operative Plan: Extubation in OR  Informed Consent: I have reviewed the patients History and Physical, chart, labs and discussed the procedure including the risks, benefits and alternatives for the proposed anesthesia with the patient or authorized representative who has indicated his/her understanding and acceptance.     Dental advisory given  Plan Discussed with: CRNA  Anesthesia Plan Comments:        Anesthesia Quick Evaluation

## 2018-08-28 NOTE — Anesthesia Procedure Notes (Signed)
Procedure Name: LMA Insertion Performed by: Marybella Ethier M, CRNA Pre-anesthesia Checklist: Patient identified, Emergency Drugs available, Suction available, Patient being monitored and Timeout performed Patient Re-evaluated:Patient Re-evaluated prior to induction Oxygen Delivery Method: Circle system utilized Preoxygenation: Pre-oxygenation with 100% oxygen Induction Type: IV induction LMA: LMA inserted LMA Size: 4.0 Tube type: Oral Number of attempts: 1 Placement Confirmation: positive ETCO2,  CO2 detector and breath sounds checked- equal and bilateral Tube secured with: Tape Dental Injury: Teeth and Oropharynx as per pre-operative assessment        

## 2018-08-28 NOTE — Progress Notes (Signed)
Assisted Dr. Ellender with right, ultrasound guided, supraclavicular block. Side rails up, monitors on throughout procedure. See vital signs in flow sheet. Tolerated Procedure well. 

## 2018-08-29 ENCOUNTER — Encounter (HOSPITAL_BASED_OUTPATIENT_CLINIC_OR_DEPARTMENT_OTHER): Payer: Self-pay | Admitting: Orthopaedic Surgery
